# Patient Record
Sex: Female | Born: 1980
Health system: Southern US, Community
[De-identification: ages and names within clinical notes are randomized; demographics above are authoritative.]

## PROBLEM LIST (undated history)

## (undated) ENCOUNTER — Emergency Department (HOSPITAL_COMMUNITY): Admission: EM | Payer: Commercial Managed Care - PPO | Source: Home / Self Care

## (undated) DIAGNOSIS — N200 Calculus of kidney: Secondary | ICD-10-CM

## (undated) DIAGNOSIS — D649 Anemia, unspecified: Secondary | ICD-10-CM

## (undated) HISTORY — DX: Anemia, unspecified: D64.9

## (undated) HISTORY — DX: Calculus of kidney: N20.0

---

## 2004-11-27 ENCOUNTER — Other Ambulatory Visit: Admission: RE | Admit: 2004-11-27 | Discharge: 2004-11-27 | Payer: Self-pay | Admitting: Gynecology

## 2005-11-28 ENCOUNTER — Other Ambulatory Visit: Admission: RE | Admit: 2005-11-28 | Discharge: 2005-11-28 | Payer: Self-pay | Admitting: Gynecology

## 2007-07-04 ENCOUNTER — Inpatient Hospital Stay (HOSPITAL_COMMUNITY): Admission: AD | Admit: 2007-07-04 | Discharge: 2007-07-06 | Payer: Self-pay | Admitting: Obstetrics and Gynecology

## 2007-08-05 ENCOUNTER — Inpatient Hospital Stay (HOSPITAL_COMMUNITY): Admission: AD | Admit: 2007-08-05 | Discharge: 2007-08-07 | Payer: Self-pay | Admitting: Obstetrics and Gynecology

## 2008-07-07 ENCOUNTER — Emergency Department (HOSPITAL_COMMUNITY): Admission: EM | Admit: 2008-07-07 | Discharge: 2008-07-08 | Payer: Self-pay | Admitting: Family Medicine

## 2010-03-04 ENCOUNTER — Inpatient Hospital Stay (HOSPITAL_COMMUNITY): Admission: AD | Admit: 2010-03-04 | Discharge: 2010-03-06 | Payer: Self-pay | Admitting: Obstetrics & Gynecology

## 2010-04-02 IMAGING — CT CT PELVIS W/O CM
2 of 4 series · 17 of 46 positions shown, 19 images · non-contrast
Comparison: None available.

CT ABDOMEN

CLINICAL DATA: Gross hematuria.  Severe right flank pain.

CT ABDOMEN AND PELVIS WITHOUT CONTRAST
TECHNIQUE: Multidetector CT imaging of the abdomen and pelvis was
performed following the standard protocol without intravenous
contrast.

[Series 2: renal stone 5.0 b31f st · axial · 0.64mm/px · z∈[-502,-192]mm · 14 of 68 slices shown, 16 images]
[im 3/68  soft-tissue]
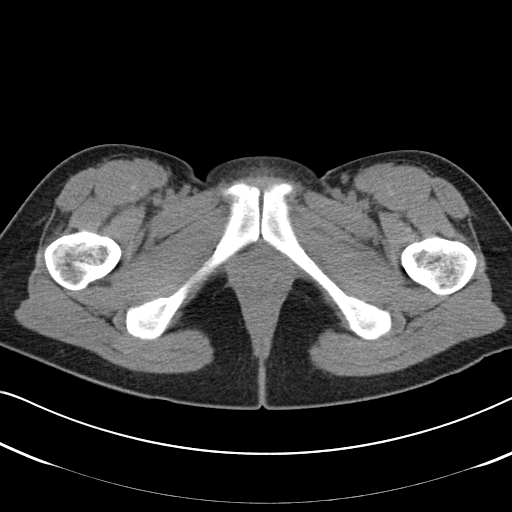
[im 3/68  bone]
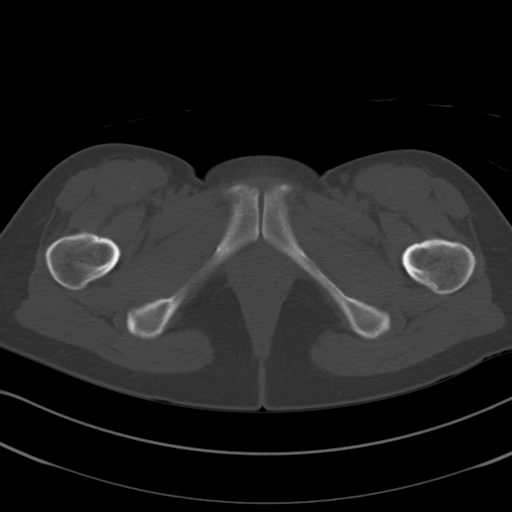
[im 9/68  soft-tissue]
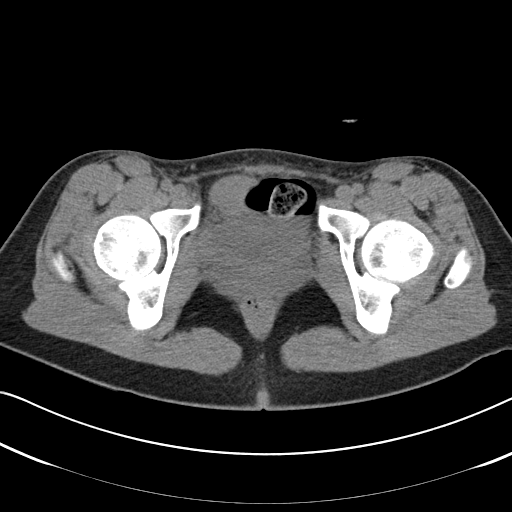
[im 14/68  soft-tissue]
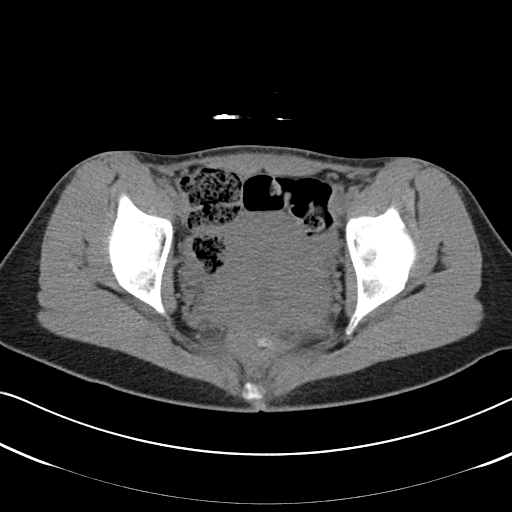
[im 19/68  soft-tissue]
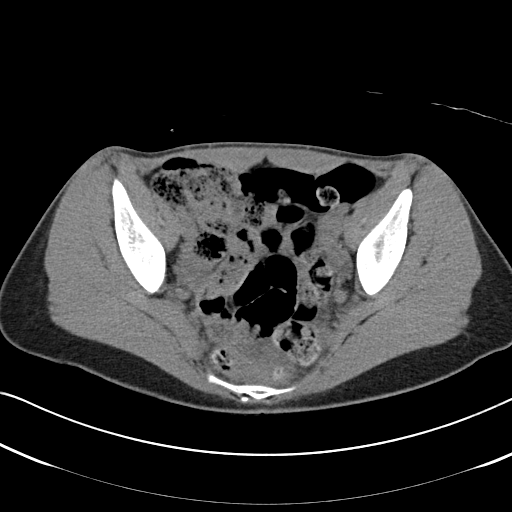
[im 22/68  soft-tissue]
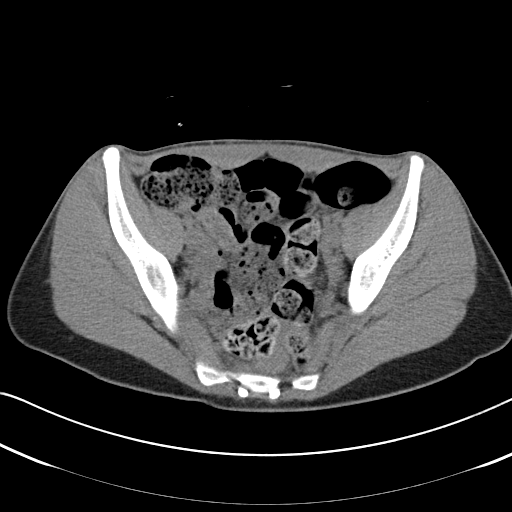
[im 27/68  soft-tissue]
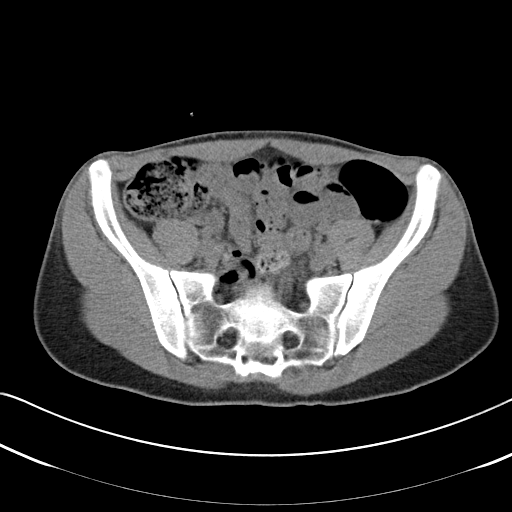
[im 33/68  soft-tissue]
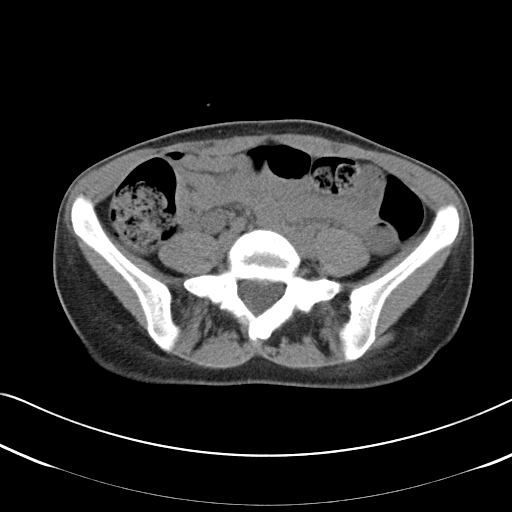
[im 35/68  soft-tissue]
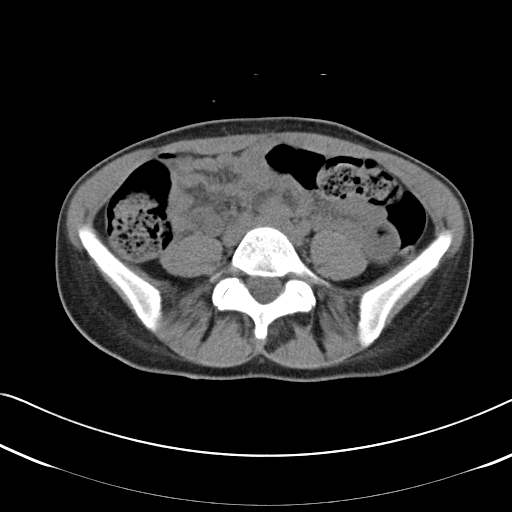
[im 41/68  soft-tissue]
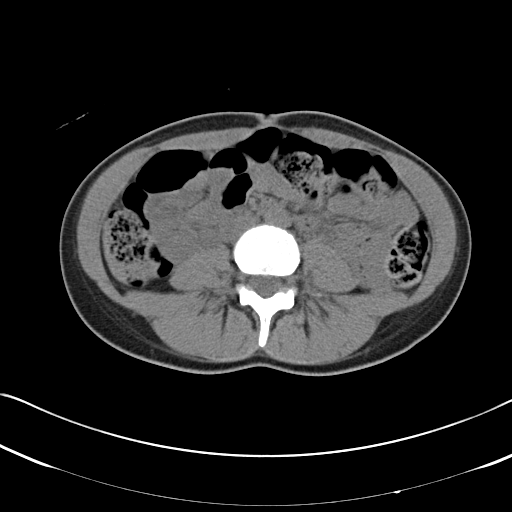
[im 41/68  bone]
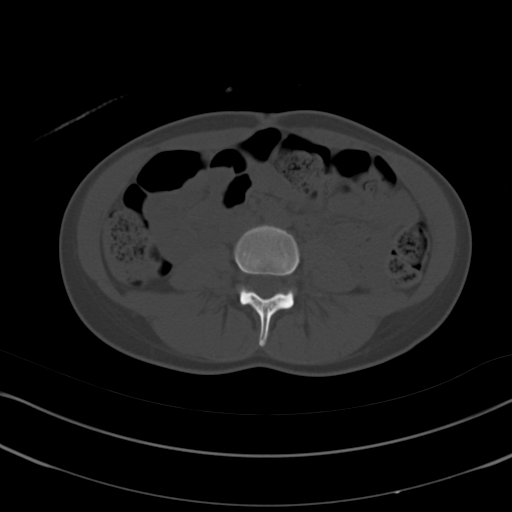
[im 46/68  soft-tissue]
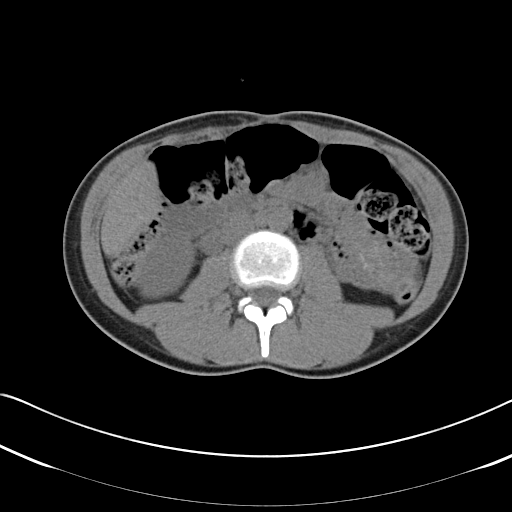
[im 51/68  soft-tissue]
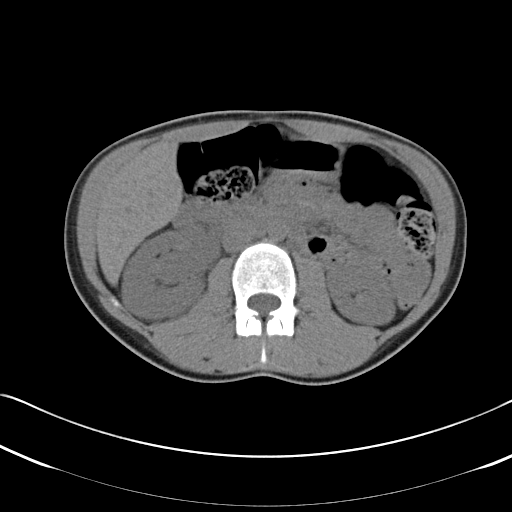
[im 54/68  soft-tissue]
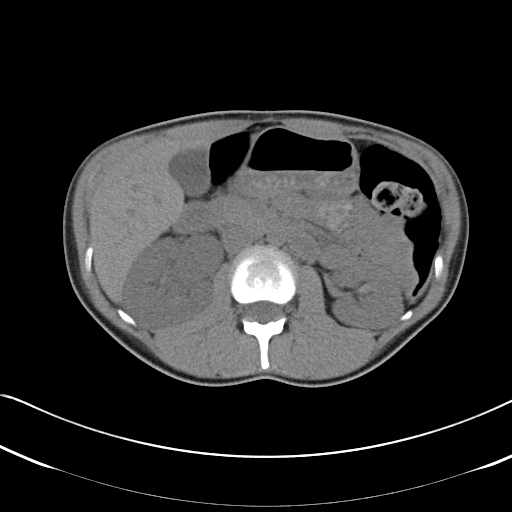
[im 59/68  soft-tissue]
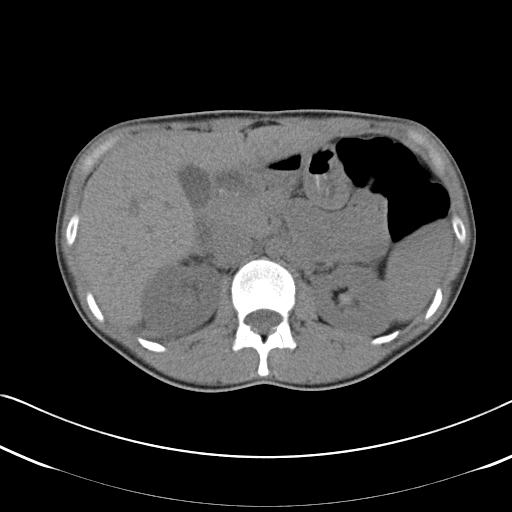
[im 65/68  soft-tissue]
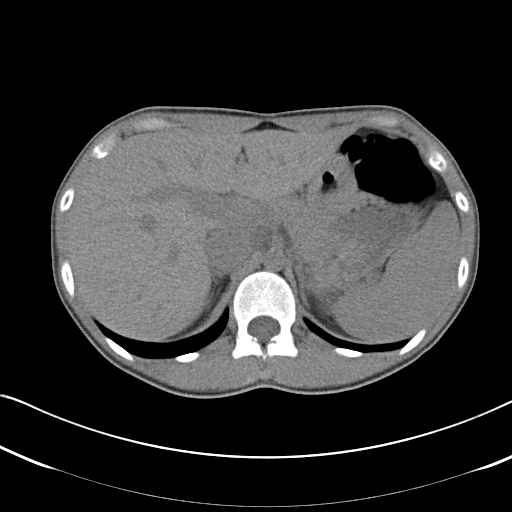

[Series 602: coronal abd · coronal · 0.94mm/px · 3 of 86 slices shown]
[im 29/86  soft-tissue]
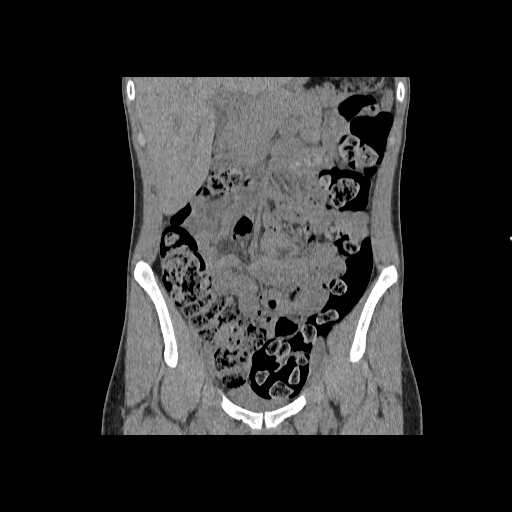
[im 38/86  soft-tissue]
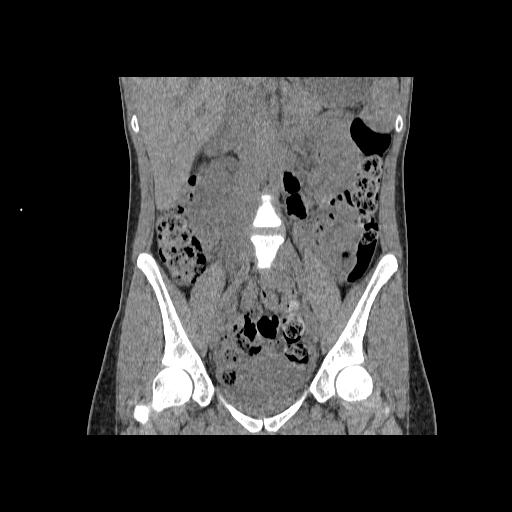
[im 48/86  soft-tissue]
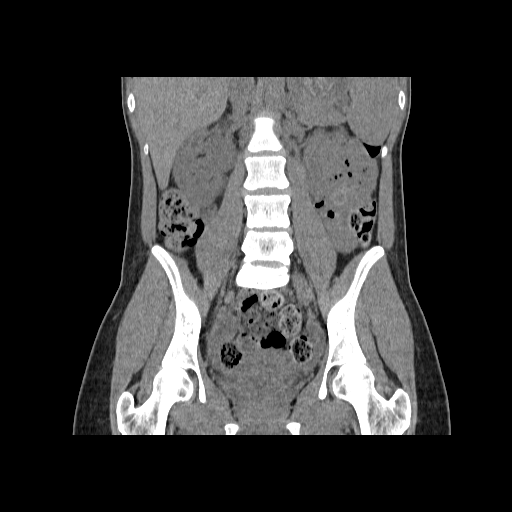

[17 of 46 positions shown; findings below may reference images not displayed]

FINDINGS: No pleural effusion is identified.  Small amount of lung
parenchyma which is visualized is clear.

The patient has severe right hydronephrosis and marked dilatation
of the right ureter due to a 0.3 cm stone at the right
ureterovesicle junction.  No other urinary tract stones are
identified on the right or left.

The visualized portions of the liver, gallbladder, spleen, pancreas
and adrenal glands have a normal uninfused appearance.  Stomach and
small bowel are unremarkable.  No bony abnormality.
IMPRESSION: Severe right hydronephrosis due to a 0.3 cm right UVJ stone.
Abdomen is otherwise negative.

CT PELVIS
FINDINGS: Uterus and adnexa are unremarkable.  Moderate stool
burden is noted.  Colon is otherwise normal appearance.  No pelvic
fluid or lymphadenopathy.  No focal bony abnormality.
IMPRESSION: 1.  Right UVJ stone is again noted.
2.  Moderate stool burden.

## 2010-09-29 LAB — CBC
MCHC: 34.2 g/dL (ref 30.0–36.0)
MCV: 97.7 fL (ref 78.0–100.0)
Platelets: 117 10*3/uL — ABNORMAL LOW (ref 150–400)
Platelets: 126 10*3/uL — ABNORMAL LOW (ref 150–400)
RBC: 3.57 MIL/uL — ABNORMAL LOW (ref 3.87–5.11)
RBC: 4.39 MIL/uL (ref 3.87–5.11)
RDW: 13.1 % (ref 11.5–15.5)
WBC: 12.4 10*3/uL — ABNORMAL HIGH (ref 4.0–10.5)

## 2010-11-28 NOTE — Discharge Summary (Signed)
Megan Stout, Megan Stout                 ACCOUNT NO.:  [id number]   MEDICAL RECORD NO.:  [id number]          PATIENT TYPE:  INP   LOCATION:  9156                          FACILITY:  [facility]   PHYSICIAN:  Ilda Mori, M.D.   DATE OF BIRTH:  Feb 12, 1981   DATE OF ADMISSION:  07/04/2007  DATE OF DISCHARGE:  07/06/2007                               DISCHARGE SUMMARY   FINAL DIAGNOSIS:  Preterm labor, intrauterine pregnancy 33 weeks,  undelivered.   SECONDARY DIAGNOSIS:  None.   PROCEDURES:  1. IV tocolysis.  2. Betamethasone.  3. Steroid protocol for lung maturity.   COMPLICATIONS:  None.   CONDITION ON DISCHARGE:  Improved.   This is a 30 year old primigravid female who was admitted to the  hospital with a cervical exam of 3 cm dilated, 60% effaced, -2 station,  in the office on December 19.  The patient denied feeling severe  contractions but was having some pressure which prompted the pelvic  exam.  In the hospital, the patient was noted to have contractions, and  IV magnesium was instituted.  In addition, the patient was started on a  dexamethasone protocol, to accelerate fetal lung maturity.  The  patient's hospital course was uncomplicated.  She did respond to the  magnesium, with a significant decrease in the the frequency of uterine  contractions.  On the morning of December 21, a fetal fibronectin was  obtained, and this came back as positive.  A group B strep is still  pending.  Despite the fact that she had positive fetal fibronectin, the  patient was feeling good and having minimal uterine contractions.  She  completed her 48-hour steroid course.  In addition a pelvic exam was  performed which was reassuring with a cervical thickness of 2-3 cm, and  the internal os only 1 to 2 cm dilated.  She was therefore discharged,  told to eat a regular diet, was asked to maintain bedrest with bathroom  privileges.  She was given Procardia to take 4 times a day, 20 mg, to  help quiet  the uterus and told to continue prenatal vitamins.  She was  asked to return to office in 2 weeks for follow-up evaluation.  Her  laboratory data showed an ultrasound with good fetal growth (64  percentile),  cervical length was read out as 9 to 10 mm with funneling  at the internal os.  Her group B strep is pending at the time of this  dictation.      Ilda Mori, M.D.  Electronically Signed     RK/MEDQ  D:  07/06/2007  T:  07/07/2007  Job:  [id number]

## 2011-04-05 LAB — CBC
HCT: 31.5 — ABNORMAL LOW
HCT: 40.4
Hemoglobin: 11.1 — ABNORMAL LOW
MCHC: 34.6
MCHC: 35.4
MCV: 95.3
Platelets: 153
RBC: 3.33 — ABNORMAL LOW
RDW: 12.4

## 2011-04-05 LAB — RPR: RPR Ser Ql: NONREACTIVE

## 2011-04-20 LAB — POCT URINALYSIS DIP (DEVICE)
Glucose, UA: NEGATIVE mg/dL
Nitrite: NEGATIVE
Protein, ur: 100 mg/dL — AB
Specific Gravity, Urine: 1.025 (ref 1.005–1.030)
Urobilinogen, UA: 0.2 mg/dL (ref 0.0–1.0)
pH: 6 (ref 5.0–8.0)

## 2011-04-20 LAB — STREP B DNA PROBE

## 2011-04-20 LAB — DIFFERENTIAL
Basophils Absolute: 0 10*3/uL (ref 0.0–0.1)
Basophils Relative: 0 % (ref 0–1)
Eosinophils Absolute: 0.1 10*3/uL (ref 0.0–0.7)
Lymphocytes Relative: 10 % — ABNORMAL LOW (ref 12–46)
Monocytes Relative: 7 % (ref 3–12)
Neutrophils Relative %: 83 % — ABNORMAL HIGH (ref 43–77)

## 2011-04-20 LAB — BASIC METABOLIC PANEL
BUN: 21 mg/dL (ref 6–23)
Chloride: 102 mEq/L (ref 96–112)
GFR calc Af Amer: >60 mL/min (ref >60)
GFR calc non Af Amer: >60 mL/min (ref >60)
Sodium: 137 mEq/L (ref 135–145)

## 2011-04-20 LAB — CBC: WBC: 11.6 10*3/uL — ABNORMAL HIGH (ref 4.0–10.5)

## 2011-04-20 LAB — FETAL FIBRONECTIN: Fetal Fibronectin: POSITIVE

## 2013-12-31 ENCOUNTER — Encounter: Payer: Self-pay | Admitting: Women's Health

## 2013-12-31 ENCOUNTER — Other Ambulatory Visit (HOSPITAL_COMMUNITY)
Admission: RE | Admit: 2013-12-31 | Discharge: 2013-12-31 | Disposition: A | Discharge status: Home / Self Care | Payer: 59 | Source: Ambulatory Visit | Attending: Gynecology | Admitting: Gynecology

## 2013-12-31 ENCOUNTER — Ambulatory Visit (INDEPENDENT_AMBULATORY_CARE_PROVIDER_SITE_OTHER): Payer: 59 | Admitting: Women's Health

## 2013-12-31 VITALS — BP 116/70 | Ht 65.0 in | Wt 121.0 lb

## 2013-12-31 DIAGNOSIS — Z01419 Encounter for gynecological examination (general) (routine) without abnormal findings: Secondary | ICD-10-CM

## 2013-12-31 NOTE — Progress Notes (Signed)
Megan Stout 04/19/1981 [id number]    History:    Presents for annual exam.  Regular monthly cycle with occasional spotting at questionable ovulation time/vasectomy. Reports normal Paps.  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 Father committed suicide 3 years ago.   ROS:  A  12 point ROS was performed and pertinent positives and negatives are included.  Exam:  Filed Vitals:   12/31/13 1557  BP: 116/70    General appearance:  Normal Thyroid:  Symmetrical, normal in size, without palpable masses or nodularity. Respiratory  Auscultation:  Clear without wheezing or rhonchi Cardiovascular  Auscultation:  Regular rate, without rubs, murmurs or gallops  Edema/varicosities:  Not grossly evident Abdominal  Soft,nontender, without masses, guarding or rebound.  Liver/spleen:  No organomegaly noted  Hernia:  None appreciated  Skin  Inspection:  Grossly normal   Breasts: Examined lying and sitting.     Right: Without masses, retractions, discharge or axillary adenopathy.     Left: Without masses, retractions, discharge or axillary adenopathy. Gentitourinary   Inguinal/mons:  Normal without inguinal adenopathy  External genitalia:  Normal  BUS/Urethra/Skene's glands:  Normal  Vagina:  Normal  Cervix:  Normal  Uterus:  normal in size, shape and contour.  Midline and mobile  Adnexa/parametria:     Rt: Without masses or tenderness.   Lt: Without masses or tenderness.  Anus and perineum: Normal  Digital rectal exam: Normal sphincter tone without palpated masses or tenderness  Assessment/Plan:  33 y.o.MWF G2P2  for annual exam with no complaints.  Normal GYN exam/vasectomy  Plan: Questions if anything can be done to postpone cycle, on day 6 of cycle, options  reviewed. NuvaRing sample placed, leave in 4 weeks, remove after trip. Reviewed slight risk for blood clots and strokes. Had been on OC's in the past without problem. DEXA to keep menstrual calendar if continued spotting instructed to call. SBE's, regular exercise, calcium rich diet, vitamin D 1000 daily encouraged. CBC, lipid panel, UA, Pap.  Note: This dictation was prepared with Dragon/digital dictation.  Any transcriptional errors that result are unintentional. Megan ChallengerYOUNG,Megan J [facility] HospitalWHNP, 5:11 PM 12/31/2013

## 2013-12-31 NOTE — Patient Instructions (Signed)

## 2014-01-01 LAB — CBC WITH DIFFERENTIAL/PLATELET
BASOS ABS: 0.1 10*3/uL (ref 0.0–0.1)
Basophils Relative: 1 % (ref 0–1)
EOS ABS: 0.1 10*3/uL (ref 0.0–0.7)
Eosinophils Relative: 2 % (ref 0–5)
HCT: 37.3 % (ref 36.0–46.0)
Hemoglobin: 12.3 g/dL (ref 12.0–15.0)
Lymphocytes Relative: 32 % (ref 12–46)
Lymphs Abs: 1.7 10*3/uL (ref 0.7–4.0)
MCH: 29.2 pg (ref 26.0–34.0)
MCHC: 33 g/dL (ref 30.0–36.0)
MCV: 88.6 fL (ref 78.0–100.0)
MONO ABS: 0.5 10*3/uL (ref 0.1–1.0)
Monocytes Relative: 10 % (ref 3–12)
NEUTROS PCT: 55 % (ref 43–77)
Neutro Abs: 2.9 10*3/uL (ref 1.7–7.7)
Platelets: 201 10*3/uL (ref 150–400)
RBC: 4.21 MIL/uL (ref 3.87–5.11)
RDW: 13.8 % (ref 11.5–15.5)
WBC: 5.2 10*3/uL (ref 4.0–10.5)

## 2014-01-01 LAB — URINALYSIS W MICROSCOPIC + REFLEX CULTURE
Bilirubin Urine: NEGATIVE
CRYSTALS: NONE SEEN
Casts: NONE SEEN
Glucose, UA: NEGATIVE mg/dL
Ketones, ur: NEGATIVE mg/dL
Leukocytes, UA: NEGATIVE
Nitrite: POSITIVE — AB
PROTEIN: NEGATIVE mg/dL
SPECIFIC GRAVITY, URINE: 1.023 (ref 1.005–1.030)
UROBILINOGEN UA: 0.2 mg/dL (ref 0.0–1.0)
pH: 6 (ref 5.0–8.0)

## 2014-01-01 LAB — LIPID PANEL
CHOLESTEROL: 128 mg/dL (ref 0–200)
HDL: 53 mg/dL (ref >39)
LDL Cholesterol: 59 mg/dL (ref 0–99)
Total CHOL/HDL Ratio: 2.4 Ratio
Triglycerides: 80 mg/dL (ref <150)
VLDL: 16 mg/dL (ref 0–40)

## 2014-01-03 LAB — URINE CULTURE

## 2014-01-04 LAB — CYTOLOGY - PAP

## 2014-01-05 ENCOUNTER — Other Ambulatory Visit: Payer: Self-pay | Admitting: Women's Health

## 2014-01-05 MED ORDER — CIPROFLOXACIN HCL 500 MG PO TABS: 500.0000 mg | ORAL_TABLET | Freq: Two times a day (BID) | ORAL | Status: DC

## 2014-04-30 ENCOUNTER — Other Ambulatory Visit: Payer: Self-pay

## 2014-05-17 ENCOUNTER — Encounter: Payer: Self-pay | Admitting: Women's Health

## 2015-01-04 ENCOUNTER — Encounter: Payer: Self-pay | Admitting: Women's Health

## 2015-01-04 ENCOUNTER — Ambulatory Visit (INDEPENDENT_AMBULATORY_CARE_PROVIDER_SITE_OTHER): Payer: 59 | Admitting: Women's Health

## 2015-01-04 VITALS — BP 118/80 | Ht 65.0 in | Wt 120.0 lb

## 2015-01-04 DIAGNOSIS — Z01419 Encounter for gynecological examination (general) (routine) without abnormal findings: Secondary | ICD-10-CM

## 2015-01-04 NOTE — Patient Instructions (Signed)

## 2015-01-04 NOTE — Progress Notes (Signed)
Megan Stout 1980-07-31 [id number]    History:    Presents for annual exam.  Regular monthly cycles/vasectomy. Normal Pap history. Normal labs at work.  Past medical history, past surgical history, family history and social history were all reviewed and documented in the EPIC chart. 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 Building a new home. Father suicide 2012.  ROS:  A ROS was performed and pertinent positives and negatives are included.  Exam:  Filed Vitals:   01/04/15 0900  BP: 118/80    General appearance:  Normal Thyroid:  Symmetrical, normal in size, without palpable masses or nodularity. Respiratory  Auscultation:  Clear without wheezing or rhonchi Cardiovascular  Auscultation:  Regular rate, without rubs, murmurs or gallops  Edema/varicosities:  Not grossly evident Abdominal  Soft,nontender, without masses, guarding or rebound.  Liver/spleen:  No organomegaly noted  Hernia:  None appreciated  Skin  Inspection:  Grossly normal   Breasts: Examined lying and sitting.     Right: Without masses, retractions, discharge or axillary adenopathy.     Left: Without masses, retractions, discharge or axillary adenopathy. Gentitourinary   Inguinal/mons:  Normal without inguinal adenopathy  External genitalia:  Normal  BUS/Urethra/Skene's glands:  Normal  Vagina:  Normal  Cervix:  Normal  Uterus:  normal in size, shape and contour.  Midline and mobile  Adnexa/parametria:     Rt: Without masses or tenderness.   Lt: Without masses or tenderness.  Anus and perineum: Normal  Digital rectal exam: Normal sphincter tone without palpated masses or tenderness  Assessment/Plan:  34 y.o.MWF G2P2  for annual exam with no complaints.  Monthly cycle/vasectomy  Plan: UA, Pap normal 2015, new Pap screening guidelines reviewed. Normal labs through work. SBE's, exercise, calcium rich diet, vitamin D 1000 daily  encouraged.  Harrington Challenger [facility], 9:30 AM 01/04/2015

## 2015-01-05 LAB — URINALYSIS W MICROSCOPIC + REFLEX CULTURE
Bilirubin Urine: NEGATIVE
Casts: NONE SEEN
Crystals: NONE SEEN
GLUCOSE, UA: NEGATIVE mg/dL
Hgb urine dipstick: NEGATIVE
Ketones, ur: NEGATIVE mg/dL
LEUKOCYTES UA: NEGATIVE
Nitrite: NEGATIVE
PROTEIN: NEGATIVE mg/dL
Specific Gravity, Urine: 1.023 (ref 1.005–1.030)
UROBILINOGEN UA: 0.2 mg/dL (ref 0.0–1.0)
pH: 7.5 (ref 5.0–8.0)

## 2015-06-04 ENCOUNTER — Other Ambulatory Visit (HOSPITAL_COMMUNITY): Payer: Self-pay | Admitting: Internal Medicine

## 2015-06-04 ENCOUNTER — Ambulatory Visit (HOSPITAL_COMMUNITY)
Admission: RE | Admit: 2015-06-04 | Discharge: 2015-06-04 | Disposition: A | Discharge status: Home / Self Care | Payer: 59 | Source: Ambulatory Visit | Attending: Internal Medicine | Admitting: Internal Medicine

## 2015-06-04 DIAGNOSIS — R52 Pain, unspecified: Secondary | ICD-10-CM

## 2015-06-04 DIAGNOSIS — R079 Chest pain, unspecified: Secondary | ICD-10-CM | POA: Diagnosis not present

## 2016-02-08 ENCOUNTER — Encounter: Payer: Self-pay | Admitting: Women's Health

## 2016-02-08 ENCOUNTER — Ambulatory Visit (INDEPENDENT_AMBULATORY_CARE_PROVIDER_SITE_OTHER): Payer: 59 | Admitting: Women's Health

## 2016-02-08 VITALS — BP 118/78 | Ht 65.0 in | Wt 124.0 lb

## 2016-02-08 DIAGNOSIS — Z01419 Encounter for gynecological examination (general) (routine) without abnormal findings: Secondary | ICD-10-CM

## 2016-02-08 NOTE — Patient Instructions (Signed)
Health Maintenance, Female Adopting a healthy lifestyle and getting preventive care can go a long way to promote health and wellness. Talk with your health care provider about what schedule of regular examinations is right for you. This is a good chance for you to check in with your provider about disease prevention and staying healthy. In between checkups, there are plenty of things you can do on your own. Experts have done a lot of research about which lifestyle changes and preventive measures are most likely to keep you healthy. Ask your health care provider for more information. WEIGHT AND DIET  Eat a healthy diet  Be sure to include plenty of vegetables, fruits, low-fat dairy products, and lean protein.  Do not eat a lot of foods high in solid fats, added sugars, or salt.  Get regular exercise. This is one of the most important things you can do for your health.  Most adults should exercise for at least 150 minutes each week. The exercise should increase your heart rate and make you sweat (moderate-intensity exercise).  Most adults should also do strengthening exercises at least twice a week. This is in addition to the moderate-intensity exercise.  Maintain a healthy weight  Body mass index (BMI) is a measurement that can be used to identify possible weight problems. It estimates body fat based on height and weight. Your health care provider can help determine your BMI and help you achieve or maintain a healthy weight.  For females 20 years of age and older:   A BMI below 18.5 is considered underweight.  A BMI of 18.5 to 24.9 is normal.  A BMI of 25 to 29.9 is considered overweight.  A BMI of 30 and above is considered obese.  Watch levels of cholesterol and blood lipids  You should start having your blood tested for lipids and cholesterol at 34 years of age, then have this test every 5 years.  You may need to have your cholesterol levels checked more often if:  Your lipid  or cholesterol levels are high.  You are older than 34 years of age.  You are at high risk for heart disease.  CANCER SCREENING   Lung Cancer  Lung cancer screening is recommended for adults 55-80 years old who are at high risk for lung cancer because of a history of smoking.  A yearly low-dose CT scan of the lungs is recommended for people who:  Currently smoke.  Have quit within the past 15 years.  Have at least a 30-pack-year history of smoking. A pack year is smoking an average of one pack of cigarettes a day for 1 year.  Yearly screening should continue until it has been 15 years since you quit.  Yearly screening should stop if you develop a health problem that would prevent you from having lung cancer treatment.  Breast Cancer  Practice breast self-awareness. This means understanding how your breasts normally appear and feel.  It also means doing regular breast self-exams. Let your health care provider know about any changes, no matter how small.  If you are in your 20s or 30s, you should have a clinical breast exam (CBE) by a health care provider every 1-3 years as part of a regular health exam.  If you are 40 or older, have a CBE every year. Also consider having a breast X-ray (mammogram) every year.  If you have a family history of breast cancer, talk to your health care provider about genetic screening.  If you   are at high risk for breast cancer, talk to your health care provider about having an MRI and a mammogram every year.  Breast cancer gene (BRCA) assessment is recommended for women who have family members with BRCA-related cancers. BRCA-related cancers include:  Breast.  Ovarian.  Tubal.  Peritoneal cancers.  Results of the assessment will determine the need for genetic counseling and BRCA1 and BRCA2 testing. Cervical Cancer Your health care provider may recommend that you be screened regularly for cancer of the pelvic organs (ovaries, uterus, and  vagina). This screening involves a pelvic examination, including checking for microscopic changes to the surface of your cervix (Pap test). You may be encouraged to have this screening done every 3 years, beginning at age 21.  For women ages 30-65, health care providers may recommend pelvic exams and Pap testing every 3 years, or they may recommend the Pap and pelvic exam, combined with testing for human papilloma virus (HPV), every 5 years. Some types of HPV increase your risk of cervical cancer. Testing for HPV may also be done on women of any age with unclear Pap test results.  Other health care providers may not recommend any screening for nonpregnant women who are considered low risk for pelvic cancer and who do not have symptoms. Ask your health care provider if a screening pelvic exam is right for you.  If you have had past treatment for cervical cancer or a condition that could lead to cancer, you need Pap tests and screening for cancer for at least 20 years after your treatment. If Pap tests have been discontinued, your risk factors (such as having a new sexual partner) need to be reassessed to determine if screening should resume. Some women have medical problems that increase the chance of getting cervical cancer. In these cases, your health care provider may recommend more frequent screening and Pap tests. Colorectal Cancer  This type of cancer can be detected and often prevented.  Routine colorectal cancer screening usually begins at 34 years of age and continues through 34 years of age.  Your health care provider may recommend screening at an earlier age if you have risk factors for colon cancer.  Your health care provider may also recommend using home test kits to check for hidden blood in the stool.  A small camera at the end of a tube can be used to examine your colon directly (sigmoidoscopy or colonoscopy). This is done to check for the earliest forms of colorectal  cancer.  Routine screening usually begins at age 50.  Direct examination of the colon should be repeated every 5-10 years through 34 years of age. However, you may need to be screened more often if early forms of precancerous polyps or small growths are found. Skin Cancer  Check your skin from head to toe regularly.  Tell your health care provider about any new moles or changes in moles, especially if there is a change in a mole's shape or color.  Also tell your health care provider if you have a mole that is larger than the size of a pencil eraser.  Always use sunscreen. Apply sunscreen liberally and repeatedly throughout the day.  Protect yourself by wearing long sleeves, pants, a wide-brimmed hat, and sunglasses whenever you are outside. HEART DISEASE, DIABETES, AND HIGH BLOOD PRESSURE   High blood pressure causes heart disease and increases the risk of stroke. High blood pressure is more likely to develop in:  People who have blood pressure in the high end   of the normal range (130-139/85-89 mm Hg).  People who are overweight or obese.  People who are African American.  If you are 38-23 years of age, have your blood pressure checked every 3-5 years. If you are 61 years of age or older, have your blood pressure checked every year. You should have your blood pressure measured twice--once when you are at a hospital or clinic, and once when you are not at a hospital or clinic. Record the average of the two measurements. To check your blood pressure when you are not at a hospital or clinic, you can use:  An automated blood pressure machine at a pharmacy.  A home blood pressure monitor.  If you are between 45 years and 39 years old, ask your health care provider if you should take aspirin to prevent strokes.  Have regular diabetes screenings. This involves taking a blood sample to check your fasting blood sugar level.  If you are at a normal weight and have a low risk for diabetes,  have this test once every three years after 34 years of age.  If you are overweight and have a high risk for diabetes, consider being tested at a younger age or more often. PREVENTING INFECTION  Hepatitis B  If you have a higher risk for hepatitis B, you should be screened for this virus. You are considered at high risk for hepatitis B if:  You were born in a country where hepatitis B is common. Ask your health care provider which countries are considered high risk.  Your parents were born in a high-risk country, and you have not been immunized against hepatitis B (hepatitis B vaccine).  You have HIV or AIDS.  You use needles to inject street drugs.  You live with someone who has hepatitis B.  You have had sex with someone who has hepatitis B.  You get hemodialysis treatment.  You take certain medicines for conditions, including cancer, organ transplantation, and autoimmune conditions. Hepatitis C  Blood testing is recommended for:  Everyone born from [birth year] through 1964.  Anyone with known risk factors for hepatitis C. Sexually transmitted infections (STIs)  You should be screened for sexually transmitted infections (STIs) including gonorrhea and chlamydia if:  You are sexually active and are younger than 34 years of age.  You are older than 34 years of age and your health care provider tells you that you are at risk for this type of infection.  Your sexual activity has changed since you were last screened and you are at an increased risk for chlamydia or gonorrhea. Ask your health care provider if you are at risk.  If you do not have HIV, but are at risk, it may be recommended that you take a prescription medicine daily to prevent HIV infection. This is called pre-exposure prophylaxis (PrEP). You are considered at risk if:  You are sexually active and do not regularly use condoms or know the HIV status of your partner(s).  You take drugs by injection.  You are sexually  active with a partner who has HIV. Talk with your health care provider about whether you are at high risk of being infected with HIV. If you choose to begin PrEP, you should first be tested for HIV. You should then be tested every 3 months for as long as you are taking PrEP.  PREGNANCY   If you are premenopausal and you may become pregnant, ask your health care provider about preconception counseling.  If you may  become pregnant, take 400 to 800 micrograms (mcg) of folic acid every day.  If you want to prevent pregnancy, talk to your health care provider about birth control (contraception). OSTEOPOROSIS AND MENOPAUSE   Osteoporosis is a disease in which the bones lose minerals and strength with aging. This can result in serious bone fractures. Your risk for osteoporosis can be identified using a bone density scan.  If you are 61 years of age or older, or if you are at risk for osteoporosis and fractures, ask your health care provider if you should be screened.  Ask your health care provider whether you should take a calcium or vitamin D supplement to lower your risk for osteoporosis.  Menopause may have certain physical symptoms and risks.  Hormone replacement therapy may reduce some of these symptoms and risks. Talk to your health care provider about whether hormone replacement therapy is right for you.  HOME CARE INSTRUCTIONS   Schedule regular health, dental, and eye exams.  Stay current with your immunizations.   Do not use any tobacco products including cigarettes, chewing tobacco, or electronic cigarettes.  If you are pregnant, do not drink alcohol.  If you are breastfeeding, limit how much and how often you drink alcohol.  Limit alcohol intake to no more than 1 drink per day for nonpregnant women. One drink equals 12 ounces of beer, 5 ounces of wine, or 1 ounces of hard liquor.  Do not use street drugs.  Do not share needles.  Ask your health care provider for help if  you need support or information about quitting drugs.  Tell your health care provider if you often feel depressed.  Tell your health care provider if you have ever been abused or do not feel safe at home.   This information is not intended to replace advice given to you by your health care provider. Make sure you discuss any questions you have with your health care provider.   Document Released: 01/15/2011 Document Revised: 07/23/2014 Document Reviewed: 06/03/2013 Elsevier Interactive Patient Education [employer].

## 2016-02-08 NOTE — Progress Notes (Signed)
Megan Stout [birth year] [id number]    History:    Presents for annual exam.  Monthly cycle every 22-26 days/vasectomy. Normal Pap history. Labs at work lipid panel good, hemoglobin A1c 5.5.  Past medical history, past surgical history, family history and social history were all reviewed and documented in the EPIC chart. 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 All doing well.  ROS:  A ROS was performed and pertinent positives and negatives are included.  Exam:  Vitals:   02/08/16 1537  BP: 118/78    General appearance:  Normal Thyroid:  Symmetrical, normal in size, without palpable masses or nodularity. Respiratory  Auscultation:  Clear without wheezing or rhonchi Cardiovascular  Auscultation:  Regular rate, without rubs, murmurs or gallops  Edema/varicosities:  Not grossly evident Abdominal  Soft,nontender, without masses, guarding or rebound.  Liver/spleen:  No organomegaly noted  Hernia:  None appreciated  Skin  Inspection:  Grossly normal   Breasts: Examined lying and sitting.     Right: Without masses, retractions, discharge or axillary adenopathy.     Left: Without masses, retractions, discharge or axillary adenopathy. Gentitourinary   Inguinal/mons:  Normal without inguinal adenopathy  External genitalia:  Normal  BUS/Urethra/Skene's glands:  Normal  Vagina:  Normal  Cervix:  Normal  Uterus:   normal in size, shape and contour.  Midline and mobile  Adnexa/parametria:     Rt: Without masses or tenderness.   Lt: Without masses or tenderness.  Anus and perineum: Normal  Digital rectal exam: Normal sphincter tone without palpated masses or tenderness  Assessment/Plan:  35 y.o. MWF G2 P2  for annual exam with no complaints.  Monthly cycle/vasectomy  Plan: Labs from work reviewed, normal lipid panel, hemoglobin A1c 5.5, encouraged to continue healthy lifestyle of low sugar diet, regular exercise and maintaining weight. SBE's, exercise,  calcium rich diet, vitamin D 1000 daily encouraged. UA, Pap with HR HPV typing, new screening guidelines reviewed.    Harrington Challenger [facility], 4:35 PM 02/08/2016

## 2016-02-09 LAB — URINALYSIS W MICROSCOPIC + REFLEX CULTURE
BACTERIA UA: NONE SEEN [HPF]
BILIRUBIN URINE: NEGATIVE
CRYSTALS: NONE SEEN [HPF]
Casts: NONE SEEN [LPF]
GLUCOSE, UA: NEGATIVE
Hgb urine dipstick: NEGATIVE
KETONES UR: NEGATIVE
LEUKOCYTES UA: NEGATIVE
Nitrite: NEGATIVE
Protein, ur: NEGATIVE
SPECIFIC GRAVITY, URINE: 1.022 (ref 1.001–1.035)
WBC UA: NONE SEEN WBC/HPF (ref <=5)
Yeast: NONE SEEN [HPF]
pH: 7.5 (ref 5.0–8.0)

## 2016-02-10 LAB — PAP, TP IMAGING W/ HPV RNA, RFLX HPV TYPE 16,18/45: HPV mRNA, High Risk: NOT DETECTED

## 2016-02-10 LAB — URINE CULTURE

## 2016-10-31 ENCOUNTER — Other Ambulatory Visit: Payer: Self-pay | Admitting: Women's Health

## 2016-10-31 ENCOUNTER — Telehealth: Payer: Self-pay | Admitting: *Deleted

## 2016-10-31 DIAGNOSIS — Z30011 Encounter for initial prescription of contraceptive pills: Secondary | ICD-10-CM

## 2016-10-31 MED ORDER — NORETHIN ACE-ETH ESTRAD-FE 1-20 MG-MCG PO TABS: ORAL_TABLET | ORAL | 0 refills | Status: DC

## 2016-10-31 NOTE — Telephone Encounter (Signed)
Phone call, currently on cycle desiring to postpone cycle for June, will start on pills today take daily until May 16, start new pack on May 20, and continue until returning from vacation. Reviewed slight risk for blood clots and strokes. Has been on in the past without problem. Nonsmoker. Husband vasectomy. 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 Loestrin E scribe to pharmacy.

## 2016-10-31 NOTE — Telephone Encounter (Signed)
Pt has a trip to [place] scheduled in June, the week she will be gone cycle is due to start. Pt asked if birth control could be prescribed to have no cycle the week of her trip? Please advise

## 2016-11-28 ENCOUNTER — Encounter: Payer: Self-pay | Admitting: Gynecology

## 2017-02-08 ENCOUNTER — Encounter: Payer: 59 | Admitting: Women's Health

## 2017-02-12 ENCOUNTER — Encounter: Payer: Self-pay | Admitting: Women's Health

## 2017-02-12 ENCOUNTER — Ambulatory Visit (INDEPENDENT_AMBULATORY_CARE_PROVIDER_SITE_OTHER): Payer: 59 | Admitting: Women's Health

## 2017-02-12 VITALS — BP 116/76 | Ht 66.0 in | Wt 125.8 lb

## 2017-02-12 DIAGNOSIS — Z13220 Encounter for screening for lipoid disorders: Secondary | ICD-10-CM

## 2017-02-12 DIAGNOSIS — Z01419 Encounter for gynecological examination (general) (routine) without abnormal findings: Secondary | ICD-10-CM | POA: Diagnosis not present

## 2017-02-12 LAB — CBC WITH DIFFERENTIAL/PLATELET
BASOS ABS: 43 {cells}/uL (ref 0–200)
Basophils Relative: 1 %
EOS ABS: 43 {cells}/uL (ref 15–500)
Eosinophils Relative: 1 %
HCT: 34.7 % — ABNORMAL LOW (ref 35.0–45.0)
Hemoglobin: 11.1 g/dL — ABNORMAL LOW (ref 11.7–15.5)
LYMPHS PCT: 33 %
Lymphs Abs: 1419 cells/uL (ref 850–3900)
MCH: 28.1 pg (ref 27.0–33.0)
MCHC: 32 g/dL (ref 32.0–36.0)
MCV: 87.8 fL (ref 80.0–100.0)
MONOS PCT: 11 %
MPV: 12 fL (ref 7.5–12.5)
Monocytes Absolute: 473 cells/uL (ref 200–950)
Neutro Abs: 2322 cells/uL (ref 1500–7800)
Neutrophils Relative %: 54 %
PLATELETS: 169 10*3/uL (ref 140–400)
RBC: 3.95 MIL/uL (ref 3.80–5.10)
RDW: 16.4 % — AB (ref 11.0–15.0)
WBC: 4.3 10*3/uL (ref 3.8–10.8)

## 2017-02-12 NOTE — Progress Notes (Signed)
Crist FatJoy Mortell 01/13/1981 [id number]    History:    Presents for annual exam.  Monthly cycle/vasectomy. Normal Pap history.  Past medical history, past surgical history, family history and social history were all reviewed and documented in the EPIC chart. 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 Mother healthy. Father deceased from suicide from depression.  ROS:  A ROS was performed and pertinent positives and negatives are included.  Exam:  Vitals:   02/12/17 0823  BP: 116/76  Weight: 125 lb 12.8 oz (57.1 kg)  Height: 5\' 6"  (1.676 m)   Body mass index is 20.3 kg/m.   General appearance:  Normal Thyroid:  Symmetrical, normal in size, without palpable masses or nodularity. Respiratory  Auscultation:  Clear without wheezing or rhonchi Cardiovascular  Auscultation:  Regular rate, without rubs, murmurs or gallops  Edema/varicosities:  Not grossly evident Abdominal  Soft,nontender, without masses, guarding or rebound.  Liver/spleen:  No organomegaly noted  Hernia:  None appreciated  Skin  Inspection:  Grossly normal   Breasts: Examined lying and sitting.     Right: Without masses, retractions, discharge or axillary adenopathy.     Left: Without masses, retractions, discharge or axillary adenopathy. Gentitourinary   Inguinal/mons:  Normal without inguinal adenopathy  External genitalia:  Normal  BUS/Urethra/Skene's glands:  Normal  Vagina:  Normal  Cervix:  Normal  Uterus:  normal in size, shape and contour.  Midline and mobile  Adnexa/parametria:     Rt: Without masses or tenderness.   Lt: Without masses or tenderness.  Anus and perineum: Normal  Digital rectal exam: Normal sphincter tone without palpated masses or tenderness  Assessment/Plan:  36 y.o. MWF G3 P2  for annual exam with no complaints.  Monthly cycle/vasectomy  Plan: SBE's, continue  regular exercise, calcium rich diet, vitamin D 1000 daily encouraged. CBC, had normal glucose and lipid panel through health screening at work. Pap normal with negative HR HPV 2017, new screening guidelines reviewed.    Harrington Challengerancy J Roslyn Else [facility]NP, 8:24 AM 02/12/2017

## 2017-02-12 NOTE — Patient Instructions (Signed)

## 2017-02-13 ENCOUNTER — Encounter: Payer: Self-pay | Admitting: Women's Health

## 2018-02-17 ENCOUNTER — Encounter: Payer: 59 | Admitting: Women's Health

## 2018-02-24 ENCOUNTER — Ambulatory Visit (INDEPENDENT_AMBULATORY_CARE_PROVIDER_SITE_OTHER): Payer: 59 | Admitting: Women's Health

## 2018-02-24 ENCOUNTER — Encounter: Payer: Self-pay | Admitting: Women's Health

## 2018-02-24 VITALS — BP 122/80 | Ht 66.0 in | Wt 131.0 lb

## 2018-02-24 DIAGNOSIS — Z01419 Encounter for gynecological examination (general) (routine) without abnormal findings: Secondary | ICD-10-CM | POA: Diagnosis not present

## 2018-02-24 DIAGNOSIS — Z13220 Encounter for screening for lipoid disorders: Secondary | ICD-10-CM | POA: Diagnosis not present

## 2018-02-24 LAB — LIPID PANEL
CHOLESTEROL: 144 mg/dL (ref <200)
HDL: 59 mg/dL (ref >50)
LDL Cholesterol (Calc): 72 mg/dL (calc)
Non-HDL Cholesterol (Calc): 85 mg/dL (calc) (ref <130)
TRIGLYCERIDES: 46 mg/dL (ref <150)
Total CHOL/HDL Ratio: 2.4 (calc) (ref <5.0)

## 2018-02-24 LAB — CBC WITH DIFFERENTIAL/PLATELET
BASOS PCT: 1 %
Basophils Absolute: 42 cells/uL (ref 0–200)
EOS PCT: 1.7 %
Eosinophils Absolute: 71 cells/uL (ref 15–500)
HCT: 31.6 % — ABNORMAL LOW (ref 35.0–45.0)
Hemoglobin: 9.8 g/dL — ABNORMAL LOW (ref 11.7–15.5)
Lymphs Abs: 1483 cells/uL (ref 850–3900)
MCH: 25.6 pg — ABNORMAL LOW (ref 27.0–33.0)
MCHC: 31 g/dL — ABNORMAL LOW (ref 32.0–36.0)
MCV: 82.5 fL (ref 80.0–100.0)
MONOS PCT: 15.3 %
MPV: 12.2 fL (ref 7.5–12.5)
NEUTROS PCT: 46.7 %
Neutro Abs: 1961 cells/uL (ref 1500–7800)
PLATELETS: 212 10*3/uL (ref 140–400)
RBC: 3.83 10*6/uL (ref 3.80–5.10)
RDW: 14.5 % (ref 11.0–15.0)
TOTAL LYMPHOCYTE: 35.3 %
WBC mixed population: 643 cells/uL (ref 200–950)
WBC: 4.2 10*3/uL (ref 3.8–10.8)

## 2018-02-24 LAB — GLUCOSE, RANDOM: GLUCOSE: 91 mg/dL (ref 65–99)

## 2018-02-24 NOTE — Progress Notes (Signed)
Crist FatJoy Votta 09/21/1980 [id number]    History:    Presents for annual exam.  Monthly 5-6 day cycle/vasectomy.  Normal Pap history.  Past medical history, past surgical history, family history and social history were all reviewed and documented in the EPIC chart.  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  Mother healthy.  Daughter 8, son [phone] and stepson [phone] all doing well.  ROS:  A ROS was performed and pertinent positives and negatives are included.  Exam:  Vitals:   02/24/18 0827  BP: 122/80  Weight: 131 lb (59.4 kg)  Height: 5\' 6"  (1.676 m)   Body mass index is 21.14 kg/m.   General appearance:  Normal Thyroid:  Symmetrical, normal in size, without palpable masses or nodularity. Respiratory  Auscultation:  Clear without wheezing or rhonchi Cardiovascular  Auscultation:  Regular rate, without rubs, murmurs or gallops  Edema/varicosities:  Not grossly evident Abdominal  Soft,nontender, without masses, guarding or rebound.  Liver/spleen:  No organomegaly noted  Hernia:  None appreciated  Skin  Inspection:  Grossly normal   Breasts: Examined lying and sitting.     Right: Without masses, retractions, discharge or axillary adenopathy.     Left: Without masses, retractions, discharge or axillary adenopathy. Gentitourinary   Inguinal/mons:  Normal without inguinal adenopathy  External genitalia:  Normal  BUS/Urethra/Skene's glands:  Normal  Vagina:  Normal  Cervix:  Normal  Uterus:  normal in size, shape and contour.  Midline and mobile  Adnexa/parametria:     Rt: Without masses or tenderness.   Lt: Without masses or tenderness.  Anus and perineum: Normal  Digital rectal exam: Normal sphincter tone without palpated masses or tenderness  Assessment/Plan:  37 y.o. MWF G3, P2 for annual exam with no complaints.  Regular monthly cycle/vasectomy  Plan: SBE's, annual screening mammogram at 41.  Continue healthy lifestyle of regular  exercise, healthy diet, vitamin D 1000 daily encouraged.  CBC, glucose, lipid panel, Pap normal 2017, new screening guidelines reviewed.    Harrington Challengerancy J Oliviana Mcgahee [facility]NP, 8:45 AM 02/24/2018

## 2018-02-24 NOTE — Patient Instructions (Signed)

## 2018-02-25 ENCOUNTER — Other Ambulatory Visit: Payer: Self-pay | Admitting: Women's Health

## 2018-02-25 DIAGNOSIS — D649 Anemia, unspecified: Secondary | ICD-10-CM

## 2018-02-25 LAB — URINALYSIS, COMPLETE W/RFL CULTURE
BACTERIA UA: NONE SEEN /HPF
BILIRUBIN URINE: NEGATIVE
Glucose, UA: NEGATIVE
HGB URINE DIPSTICK: NEGATIVE
Hyaline Cast: NONE SEEN /LPF
KETONES UR: NEGATIVE
LEUKOCYTE ESTERASE: NEGATIVE
NITRITES URINE, INITIAL: NEGATIVE
PROTEIN: NEGATIVE
RBC / HPF: NONE SEEN /HPF (ref 0–2)
Specific Gravity, Urine: 1.02 (ref 1.001–1.03)
WBC, UA: NONE SEEN /HPF (ref 0–5)
pH: >=8.5 — AB (ref 5.0–8.0)

## 2018-02-25 LAB — NO CULTURE INDICATED

## 2018-03-04 ENCOUNTER — Encounter: Payer: Self-pay | Admitting: Women's Health

## 2018-03-04 NOTE — Progress Notes (Signed)
[facility]LL[facility] FORM COMPLETED AND FAXED

## 2019-03-02 ENCOUNTER — Other Ambulatory Visit: Payer: Self-pay

## 2019-03-03 ENCOUNTER — Ambulatory Visit (INDEPENDENT_AMBULATORY_CARE_PROVIDER_SITE_OTHER): Payer: 59 | Admitting: Women's Health

## 2019-03-03 ENCOUNTER — Encounter: Payer: Self-pay | Admitting: Women's Health

## 2019-03-03 VITALS — BP 110/78 | Ht 66.0 in | Wt 125.0 lb

## 2019-03-03 DIAGNOSIS — Z13220 Encounter for screening for lipoid disorders: Secondary | ICD-10-CM | POA: Diagnosis not present

## 2019-03-03 DIAGNOSIS — Z01419 Encounter for gynecological examination (general) (routine) without abnormal findings: Secondary | ICD-10-CM | POA: Diagnosis not present

## 2019-03-03 DIAGNOSIS — D649 Anemia, unspecified: Secondary | ICD-10-CM | POA: Insufficient documentation

## 2019-03-03 LAB — LIPID PANEL
Cholesterol: 172 mg/dL (ref <200)
HDL: 59 mg/dL (ref >=50)
LDL Cholesterol (Calc): 101 mg/dL (calc) — ABNORMAL HIGH
Non-HDL Cholesterol (Calc): 113 mg/dL (calc) (ref <130)
Total CHOL/HDL Ratio: 2.9 (calc) (ref <5.0)
Triglycerides: 42 mg/dL (ref <150)

## 2019-03-03 LAB — CBC WITH DIFFERENTIAL/PLATELET
Absolute Monocytes: 477 cells/uL (ref 200–950)
Basophils Absolute: 50 cells/uL (ref 0–200)
Basophils Relative: 1.1 %
Eosinophils Absolute: 50 cells/uL (ref 15–500)
Eosinophils Relative: 1.1 %
HCT: 38.9 % (ref 35.0–45.0)
Hemoglobin: 12.7 g/dL (ref 11.7–15.5)
Lymphs Abs: 1337 cells/uL (ref 850–3900)
MCH: 31.1 pg (ref 27.0–33.0)
MCHC: 32.6 g/dL (ref 32.0–36.0)
MCV: 95.1 fL (ref 80.0–100.0)
MPV: 11.7 fL (ref 7.5–12.5)
Monocytes Relative: 10.6 %
Neutro Abs: 2588 cells/uL (ref 1500–7800)
Neutrophils Relative %: 57.5 %
Platelets: 201 10*3/uL (ref 140–400)
RBC: 4.09 10*6/uL (ref 3.80–5.10)
RDW: 12 % (ref 11.0–15.0)
Total Lymphocyte: 29.7 %
WBC: 4.5 10*3/uL (ref 3.8–10.8)

## 2019-03-03 LAB — GLUCOSE, RANDOM: Glucose, Bld: 84 mg/dL (ref 65–99)

## 2019-03-03 NOTE — Addendum Note (Signed)
Addended by: Lorine Bears on: 03/03/2019 08:57 AM   Modules accepted: Orders

## 2019-03-03 NOTE — Progress Notes (Signed)
Megan Stout 1981-03-03 [id number]    History:    Presents for annual exam.  Regular monthly 5-day cycle vasectomy first 2 days heavy flow.  History of anemia/menorrhagia, states cycles are slightly less heavy than they were last year.  Takes an iron supplement several days per week but forgets most.  Normal Pap history.  Past medical history, past surgical history, family history and social history were all reviewed and documented in the EPIC chart.  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  Father depression /suicide  ROS:  A ROS was performed and pertinent positives and negatives are included.  Exam:  Vitals:   03/03/19 0808  BP: 110/78  Weight: 125 lb (56.7 kg)  Height: 5\' 6"  (1.676 m)   Body mass index is 20.18 kg/m.   General appearance:  Normal Thyroid:  Symmetrical, normal in size, without palpable masses or nodularity. Respiratory  Auscultation:  Clear without wheezing or rhonchi Cardiovascular  Auscultation:  Regular rate, without rubs, murmurs or gallops  Edema/varicosities:  Not grossly evident Abdominal  Soft,nontender, without masses, guarding or rebound.  Liver/spleen:  No organomegaly noted  Hernia:  None appreciated  Skin  Inspection:  Grossly normal   Breasts: Examined lying and sitting.     Right: Without masses, retractions, discharge or axillary adenopathy.     Left: Without masses, retractions, discharge or axillary adenopathy. Gentitourinary   Inguinal/mons:  Normal without inguinal adenopathy  External genitalia:  Normal  BUS/Urethra/Skene's glands:  Normal  Vagina:  Normal  Cervix:  Normal  Uterus:  normal in size, shape and contour.  Midline and mobile  Adnexa/parametria:     Rt: Without masses or tenderness.   Lt: Without masses or tenderness.  Anus and perineum: Normal  Digital rectal exam: Normal sphincter tone without palpated masses or  tenderness  Assessment/Plan:  38 y.o. MWF G3, P2 for annual exam with no complaints.  Regular monthly 5-day cycle/vasectomy History of anemia/menorrhagia  Plan: Encouraged woman's One-A-Day that has iron in it, iron rich foods daily.  SBEs, annual screening mammogram at 40, calcium rich foods, continue regular exercise.  Instructed to call if cycles become heavier or problematic.  Options of OCs/IUDs discussed.  CBC, glucose, lipid panel, Pap with HR HPV typing, new screening guidelines reviewed.  Health physical form completed    Huel Cote [facility], 8:25 AM 03/03/2019

## 2019-03-03 NOTE — Patient Instructions (Signed)
Health Maintenance, Female Adopting a healthy lifestyle and getting preventive care are important in promoting health and wellness. Ask your health care provider about:  The right schedule for you to have regular tests and exams.  Things you can do on your own to prevent diseases and keep yourself healthy. What should I know about diet, weight, and exercise? Eat a healthy diet   Eat a diet that includes plenty of vegetables, fruits, low-fat dairy products, and lean protein.  Do not eat a lot of foods that are high in solid fats, added sugars, or sodium. Maintain a healthy weight Body mass index (BMI) is used to identify weight problems. It estimates body fat based on height and weight. Your health care provider can help determine your BMI and help you achieve or maintain a healthy weight. Get regular exercise Get regular exercise. This is one of the most important things you can do for your health. Most adults should:  Exercise for at least 150 minutes each week. The exercise should increase your heart rate and make you sweat (moderate-intensity exercise).  Do strengthening exercises at least twice a week. This is in addition to the moderate-intensity exercise.  Spend less time sitting. Even light physical activity can be beneficial. Watch cholesterol and blood lipids Have your blood tested for lipids and cholesterol at 37 years of age, then have this test every 5 years. Have your cholesterol levels checked more often if:  Your lipid or cholesterol levels are high.  You are older than 37 years of age.  You are at high risk for heart disease. What should I know about cancer screening? Depending on your health history and family history, you may need to have cancer screening at various ages. This may include screening for:  Breast cancer.  Cervical cancer.  Colorectal cancer.  Skin cancer.  Lung cancer. What should I know about heart disease, diabetes, and high blood  pressure? Blood pressure and heart disease  High blood pressure causes heart disease and increases the risk of stroke. This is more likely to develop in people who have high blood pressure readings, are of African descent, or are overweight.  Have your blood pressure checked: ? Every 3-5 years if you are 18-39 years of age. ? Every year if you are 40 years old or older. Diabetes Have regular diabetes screenings. This checks your fasting blood sugar level. Have the screening done:  Once every three years after age 40 if you are at a normal weight and have a low risk for diabetes.  More often and at a younger age if you are overweight or have a high risk for diabetes. What should I know about preventing infection? Hepatitis B If you have a higher risk for hepatitis B, you should be screened for this virus. Talk with your health care provider to find out if you are at risk for hepatitis B infection. Hepatitis C Testing is recommended for:  Everyone born from 1945 through 1965.  Anyone with known risk factors for hepatitis C. Sexually transmitted infections (STIs)  Get screened for STIs, including gonorrhea and chlamydia, if: ? You are sexually active and are younger than 37 years of age. ? You are older than 37 years of age and your health care provider tells you that you are at risk for this type of infection. ? Your sexual activity has changed since you were last screened, and you are at increased risk for chlamydia or gonorrhea. Ask your health care provider if   you are at risk.  Ask your health care provider about whether you are at high risk for HIV. Your health care provider may recommend a prescription medicine to help prevent HIV infection. If you choose to take medicine to prevent HIV, you should first get tested for HIV. You should then be tested every 3 months for as long as you are taking the medicine. Pregnancy  If you are about to stop having your period (premenopausal) and  you may become pregnant, seek counseling before you get pregnant.  Take 400 to 800 micrograms (mcg) of folic acid every day if you become pregnant.  Ask for birth control (contraception) if you want to prevent pregnancy. Osteoporosis and menopause Osteoporosis is a disease in which the bones lose minerals and strength with aging. This can result in bone fractures. If you are 65 years old or older, or if you are at risk for osteoporosis and fractures, ask your health care provider if you should:  Be screened for bone loss.  Take a calcium or vitamin D supplement to lower your risk of fractures.  Be given hormone replacement therapy (HRT) to treat symptoms of menopause. Follow these instructions at home: Lifestyle  Do not use any products that contain nicotine or tobacco, such as cigarettes, e-cigarettes, and chewing tobacco. If you need help quitting, ask your health care provider.  Do not use street drugs.  Do not share needles.  Ask your health care provider for help if you need support or information about quitting drugs. Alcohol use  Do not drink alcohol if: ? Your health care provider tells you not to drink. ? You are pregnant, may be pregnant, or are planning to become pregnant.  If you drink alcohol: ? Limit how much you use to 0-1 drink a day. ? Limit intake if you are breastfeeding.  Be aware of how much alcohol is in your drink. In the [place] one drink equals one 12 oz bottle of beer (355 mL), one 5 oz glass of wine (148 mL), or one 1 oz glass of hard liquor (44 mL). General instructions  Schedule regular health, dental, and eye exams.  Stay current with your vaccines.  Tell your health care provider if: ? You often feel depressed. ? You have ever been abused or do not feel safe at home. Summary  Adopting a healthy lifestyle and getting preventive care are important in promoting health and wellness.  Follow your health care provider's instructions about healthy  diet, exercising, and getting tested or screened for diseases.  Follow your health care provider's instructions on monitoring your cholesterol and blood pressure. This information is not intended to replace advice given to you by your health care provider. Make sure you discuss any questions you have with your health care provider. Document Released: 01/15/2011 Document Revised: 06/25/2018 Document Reviewed: 06/25/2018 Elsevier Patient Education  2020 Elsevier Inc.  

## 2019-03-04 ENCOUNTER — Encounter: Payer: Self-pay | Admitting: Women's Health

## 2019-03-04 LAB — URINALYSIS, COMPLETE W/RFL CULTURE
Bacteria, UA: NONE SEEN /HPF
Bilirubin Urine: NEGATIVE
Glucose, UA: NEGATIVE
Hgb urine dipstick: NEGATIVE
Hyaline Cast: NONE SEEN /LPF
Leukocyte Esterase: NEGATIVE
Nitrites, Initial: NEGATIVE
Protein, ur: NEGATIVE
RBC / HPF: NONE SEEN /HPF (ref 0–2)
Specific Gravity, Urine: 1.024 (ref 1.001–1.03)
Squamous Epithelial / LPF: NONE SEEN /HPF (ref <=5)
WBC, UA: NONE SEEN /HPF (ref 0–5)
pH: 5.5 (ref 5.0–8.0)

## 2019-03-04 LAB — NO CULTURE INDICATED

## 2019-03-05 LAB — PAP, TP IMAGING W/ HPV RNA, RFLX HPV TYPE 16,18/45: HPV DNA High Risk: NOT DETECTED

## 2020-03-09 ENCOUNTER — Encounter: Payer: 59 | Admitting: Nurse Practitioner

## 2021-11-28 ENCOUNTER — Other Ambulatory Visit (HOSPITAL_COMMUNITY)
Admission: RE | Admit: 2021-11-28 | Discharge: 2021-11-28 | Disposition: A | Discharge status: Home / Self Care | Payer: 59 | Source: Ambulatory Visit | Attending: Radiology | Admitting: Radiology

## 2021-11-28 ENCOUNTER — Encounter: Payer: Self-pay | Admitting: Radiology

## 2021-11-28 ENCOUNTER — Ambulatory Visit (INDEPENDENT_AMBULATORY_CARE_PROVIDER_SITE_OTHER): Payer: 59 | Admitting: Radiology

## 2021-11-28 VITALS — BP 110/70 | Ht 65.5 in | Wt 131.0 lb

## 2021-11-28 DIAGNOSIS — D500 Iron deficiency anemia secondary to blood loss (chronic): Secondary | ICD-10-CM | POA: Diagnosis not present

## 2021-11-28 DIAGNOSIS — Z01419 Encounter for gynecological examination (general) (routine) without abnormal findings: Secondary | ICD-10-CM | POA: Diagnosis present

## 2021-11-28 DIAGNOSIS — Z1231 Encounter for screening mammogram for malignant neoplasm of breast: Secondary | ICD-10-CM

## 2021-11-28 NOTE — Progress Notes (Signed)
? ?  Megan Stout 1980/11/29 [id number] ? ? ?History:  41 y.o. G3P2 presents for annual exam. No gyn concerns. Hx of anemia, has not been taking her iron. ? ?Gynecologic History ?Patient's last menstrual period was 10/31/2021 (approximate). ?Period Cycle (Days): 28 ?Period Duration (Days): 6 ?Period Pattern: Regular ?Menstrual Flow: Moderate, Heavy ?Dysmenorrhea: None ?Contraception/Family planning: vasectomy ?Sexually active: yes ?Last Pap: 02/2019. Results were: normal ? ? ?Obstetric History ?OB History  ?Gravida Para Term Preterm AB Living  ?3 2     1 2   ?SAB IAB Ectopic Multiple Live Births  ?1          ?  ?# Outcome Date GA Lbr Len/2nd Weight Sex Delivery Anes PTL Lv  ?3 SAB           ?2 Para           ?1 Para           ? ? ? ?The following portions of the patient's history were reviewed and updated as appropriate: allergies, current medications, past family history, past medical history, past social history, past surgical history, and problem list. ? ?Review of Systems ?Pertinent items noted in HPI and remainder of comprehensive ROS otherwise negative.  ? ?Past medical history, past surgical history, family history and social history were all reviewed and documented in the EPIC chart. ? ? ?Exam: ? ?Vitals:  ? 11/28/21 0929  ?BP: 110/70  ?Weight: 131 lb (59.4 kg)  ?Height: 5' 5.5" (1.664 m)  ? ?Body mass index is 21.47 kg/m?. ? ?General appearance:  Normal ?Thyroid:  Symmetrical, normal in size, without palpable masses or nodularity. ?Respiratory ? Auscultation:  Clear without wheezing or rhonchi ?Cardiovascular ? Auscultation:  Regular rate, without rubs, murmurs or gallops ? Edema/varicosities:  Not grossly evident ?Abdominal ? Soft,nontender, without masses, guarding or rebound. ? Liver/spleen:  No organomegaly noted ? Hernia:  None appreciated ? Skin ? Inspection:  Grossly normal ?Breasts: Examined lying and sitting.  ? Right: Without masses, retractions, nipple discharge or axillary adenopathy. ? ? Left: Without  masses, retractions, nipple discharge or axillary adenopathy. ?Genitourinary  ? Inguinal/mons:  Normal without inguinal adenopathy ? External genitalia:  Normal appearing vulva with no masses, tenderness, or lesions ? BUS/Urethra/Skene's glands:  Normal without masses or exudate ? Vagina:  Normal appearing with normal color and discharge, no lesions ? Cervix:  Normal appearing without discharge or lesions ? Uterus:  Normal in size, shape and contour.  Mobile, nontender ? Adnexa/parametria:   ?  Rt: Normal in size, without masses or tenderness. ?  Lt: Normal in size, without masses or tenderness. ? Anus and perineum: Normal ?  ?Patient informed chaperone available to be present for breast and pelvic exam. Patient has requested no chaperone to be present. Patient has been advised what will be completed during breast and pelvic exam.  ? ?Assessment/Plan:   ?1. Well woman exam with routine gynecological exam ? ?- Cytology - PAP( [place]) ? ?2. Iron deficiency anemia due to chronic blood loss ? ?- CBC ?- B12 and Folate Panel ?- Ferritin ? ?3. Screening mammogram for breast cancer ?Schedule at [facility] ?  ? ? ?Discussed SBE, colonoscopy and DEXA screening as directed/appropriate. Recommend [place] of exercise weekly, including weight bearing exercise. Encouraged the use of seatbelts and sunscreen. ?Return in 1 year for annual or as needed.  ? ? WHNP-BC 9:57 AM 11/28/2021  ?

## 2021-11-29 ENCOUNTER — Other Ambulatory Visit: Payer: Self-pay | Admitting: *Deleted

## 2021-11-29 ENCOUNTER — Other Ambulatory Visit: Payer: Self-pay | Admitting: Radiology

## 2021-11-29 DIAGNOSIS — D500 Iron deficiency anemia secondary to blood loss (chronic): Secondary | ICD-10-CM

## 2021-11-29 DIAGNOSIS — R790 Abnormal level of blood mineral: Secondary | ICD-10-CM

## 2021-11-29 LAB — CBC
HCT: 33.9 % — ABNORMAL LOW (ref 35.0–45.0)
Hemoglobin: 10.5 g/dL — ABNORMAL LOW (ref 11.7–15.5)
MCH: 25.1 pg — ABNORMAL LOW (ref 27.0–33.0)
MCHC: 31 g/dL — ABNORMAL LOW (ref 32.0–36.0)
MCV: 80.9 fL (ref 80.0–100.0)
MPV: 12.5 fL (ref 7.5–12.5)
Platelets: 227 10*3/uL (ref 140–400)
RBC: 4.19 10*6/uL (ref 3.80–5.10)
RDW: 15 % (ref 11.0–15.0)
WBC: 5 10*3/uL (ref 3.8–10.8)

## 2021-11-29 LAB — B12 AND FOLATE PANEL
Folate: 23.1 ng/mL
Vitamin B-12: 331 pg/mL (ref 200–1100)

## 2021-11-29 LAB — FERRITIN: Ferritin: 5 ng/mL — ABNORMAL LOW (ref 16–154)

## 2021-11-29 MED ORDER — ACCRUFER 30 MG PO CAPS: 1.0000 | ORAL_CAPSULE | Freq: Two times a day (BID) | ORAL | 1 refills | Status: DC

## 2021-11-30 ENCOUNTER — Telehealth: Payer: Self-pay

## 2021-11-30 LAB — CYTOLOGY - PAP
Comment: NEGATIVE
Diagnosis: NEGATIVE
High risk HPV: NEGATIVE

## 2021-11-30 NOTE — Telephone Encounter (Signed)
Megan Stout pt calling to report that her Rx accrufer that was sent to pharmacy to help rebuild her iron levels is too expensive $517/month and would need a PA from insurance.   Pt states she has an OTC iron supplement she would promised she would take daily, but isnt quite sure if that would be sufficient enough.  Inquiring if there were a cheaper route.  Please advise.   11/28/21- HGB-10.5, Ferritin-5

## 2021-12-04 NOTE — Telephone Encounter (Signed)
Per ML:  "FeSO4 325 mg PO BID OTC with plenty of dark green veggies."  Pt notified and voiced understanding.  FYI. Pt stated that since she had left the [facility], the pharmacy had informed her that they could use the manufacturer's coupon for one months supply only and she can get one month free. So she will do the accrufer for the one month and start the FeSO4 325mg  supplement after. Appt made for 6 weeks f/u blood work on 01/17/22.

## 2021-12-06 ENCOUNTER — Telehealth: Payer: Self-pay | Admitting: *Deleted

## 2021-12-06 NOTE — Telephone Encounter (Signed)
PA done via cover my meds for Accrufer 30 mg caps. Medication approved from [employer] through 12/06/2022

## 2021-12-15 ENCOUNTER — Other Ambulatory Visit: Payer: Self-pay | Admitting: Radiology

## 2021-12-15 DIAGNOSIS — Z1231 Encounter for screening mammogram for malignant neoplasm of breast: Secondary | ICD-10-CM

## 2021-12-26 ENCOUNTER — Ambulatory Visit
Admission: RE | Admit: 2021-12-26 | Discharge: 2021-12-26 | Disposition: A | Discharge status: Home / Self Care | Payer: 59 | Source: Ambulatory Visit | Attending: Radiology | Admitting: Radiology

## 2021-12-26 DIAGNOSIS — Z1231 Encounter for screening mammogram for malignant neoplasm of breast: Secondary | ICD-10-CM

## 2021-12-27 ENCOUNTER — Other Ambulatory Visit: Payer: Self-pay | Admitting: Radiology

## 2021-12-27 DIAGNOSIS — R928 Other abnormal and inconclusive findings on diagnostic imaging of breast: Secondary | ICD-10-CM

## 2022-01-17 ENCOUNTER — Other Ambulatory Visit: Payer: 59

## 2022-01-17 DIAGNOSIS — R790 Abnormal level of blood mineral: Secondary | ICD-10-CM

## 2022-01-17 DIAGNOSIS — D500 Iron deficiency anemia secondary to blood loss (chronic): Secondary | ICD-10-CM

## 2022-01-18 LAB — CBC
HCT: 39.8 % (ref 35.0–45.0)
Hemoglobin: 12.8 g/dL (ref 11.7–15.5)
MCH: 28.6 pg (ref 27.0–33.0)
MCHC: 32.2 g/dL (ref 32.0–36.0)
MCV: 88.8 fL (ref 80.0–100.0)
MPV: 12.3 fL (ref 7.5–12.5)
Platelets: 211 10*3/uL (ref 140–400)
RBC: 4.48 10*6/uL (ref 3.80–5.10)
RDW: 18.6 % — ABNORMAL HIGH (ref 11.0–15.0)
WBC: 6.1 10*3/uL (ref 3.8–10.8)

## 2022-01-18 LAB — FERRITIN: Ferritin: 17 ng/mL (ref 16–154)

## 2022-01-18 NOTE — Progress Notes (Signed)
Labs improved, continue accrufrer as ordered for anemia

## 2022-01-30 ENCOUNTER — Other Ambulatory Visit: Payer: Self-pay | Admitting: Radiology

## 2022-01-30 ENCOUNTER — Ambulatory Visit
Admission: RE | Admit: 2022-01-30 | Discharge: 2022-01-30 | Disposition: A | Discharge status: Home / Self Care | Payer: 59 | Source: Ambulatory Visit | Attending: Radiology | Admitting: Radiology

## 2022-01-30 DIAGNOSIS — R928 Other abnormal and inconclusive findings on diagnostic imaging of breast: Secondary | ICD-10-CM

## 2022-01-31 ENCOUNTER — Other Ambulatory Visit: Payer: Self-pay

## 2022-01-31 DIAGNOSIS — R790 Abnormal level of blood mineral: Secondary | ICD-10-CM

## 2022-01-31 MED ORDER — ACCRUFER 30 MG PO CAPS: 1.0000 | ORAL_CAPSULE | Freq: Two times a day (BID) | ORAL | 1 refills | Status: DC

## 2022-01-31 NOTE — Telephone Encounter (Signed)
Last AEX-11/28/21  CBC rechecked 01/17/22- HGB @ 12.8

## 2022-07-12 ENCOUNTER — Other Ambulatory Visit: Payer: Self-pay | Admitting: Radiology

## 2022-07-12 ENCOUNTER — Ambulatory Visit
Admission: RE | Admit: 2022-07-12 | Discharge: 2022-07-12 | Disposition: A | Discharge status: Home / Self Care | Payer: 59 | Source: Ambulatory Visit | Attending: Radiology | Admitting: Radiology

## 2022-07-12 DIAGNOSIS — R928 Other abnormal and inconclusive findings on diagnostic imaging of breast: Secondary | ICD-10-CM

## 2022-07-20 ENCOUNTER — Other Ambulatory Visit: Payer: Self-pay | Admitting: Radiology

## 2022-07-20 DIAGNOSIS — R921 Mammographic calcification found on diagnostic imaging of breast: Secondary | ICD-10-CM

## 2022-12-04 ENCOUNTER — Ambulatory Visit: Payer: 59 | Admitting: Radiology

## 2022-12-27 ENCOUNTER — Encounter: Payer: Self-pay | Admitting: Radiology

## 2022-12-27 ENCOUNTER — Ambulatory Visit (INDEPENDENT_AMBULATORY_CARE_PROVIDER_SITE_OTHER): Payer: 59 | Admitting: Radiology

## 2022-12-27 VITALS — BP 100/60 | Ht 65.5 in | Wt 129.0 lb

## 2022-12-27 DIAGNOSIS — D500 Iron deficiency anemia secondary to blood loss (chronic): Secondary | ICD-10-CM

## 2022-12-27 DIAGNOSIS — Z01419 Encounter for gynecological examination (general) (routine) without abnormal findings: Secondary | ICD-10-CM

## 2022-12-27 NOTE — Progress Notes (Signed)
   Megan Stout 11/04/1980 [id number]   History:  42 y.o. G3P2 presents for annual exam. Anemic, more consistent taking iron, will recheck today along with fasting labs. Periods can be heavy occasionally. No other gyn concerns.  Gynecologic History Patient's last menstrual period was 12/27/2022 (exact date). Period Cycle (Days): 26 Period Duration (Days): 6 Period Pattern: Regular Menstrual Flow: Moderate, Heavy Menstrual Control: Maxi pad, Thin pad (menstural cup) Dysmenorrhea: None Contraception/Family planning: vasectomy Sexually active: yes Last Pap: 2023. Results were: normal Last mammogram: 2024. Results were: normal repeat 6 months, dense tissue  Obstetric History OB History  Gravida Para Term Preterm AB Living  3 2     1 2   SAB IAB Ectopic Multiple Live Births  1            # Outcome Date GA Lbr Len/2nd Weight Sex Delivery Anes PTL Lv  3 SAB           2 Para           1 Para              The following portions of the patient's history were reviewed and updated as appropriate: allergies, current medications, past family history, past medical history, past social history, past surgical history, and problem list.  Review of Systems Pertinent items noted in HPI and remainder of comprehensive ROS otherwise negative.   Past medical history, past surgical history, family history and social history were all reviewed and documented in the EPIC chart.   Exam:  Vitals:   12/27/22 0853  BP: 100/60  Weight: 129 lb (58.5 kg)  Height: 5' 5.5" (1.664 m)   Body mass index is 21.14 kg/m.  General appearance:  Normal Thyroid:  Symmetrical, normal in size, without palpable masses or nodularity. Respiratory  Auscultation:  Clear without wheezing or rhonchi Cardiovascular  Auscultation:  Regular rate, without rubs, murmurs or gallops  Edema/varicosities:  Not grossly evident Abdominal  Soft,nontender, without masses, guarding or rebound.  Liver/spleen:  No organomegaly  noted  Hernia:  None appreciated  Skin  Inspection:  Grossly normal Breasts: Examined lying and sitting.   Right: Without masses, retractions, nipple discharge or axillary adenopathy.   Left: Without masses, retractions, nipple discharge or axillary adenopathy. Genitourinary   Inguinal/mons:  Normal without inguinal adenopathy  External genitalia:  Normal appearing vulva with no masses, tenderness, or lesions  BUS/Urethra/Skene's glands:  Normal without masses or exudate  Vagina:  Normal appearing with normal color and discharge, no lesions  Cervix:  Normal appearing without discharge or lesions  Uterus:  Normal in size, shape and contour.  Mobile, nontender  Adnexa/parametria:     Rt: Normal in size, without masses or tenderness.   Lt: Normal in size, without masses or tenderness.  Anus and perineum: Normal   Raynelle Fanning, CMA present for exam  Assessment/Plan:   1. Well woman exam with routine gynecological exam - Lipid Profile - Comp Met (CMET)  2. Iron deficiency anemia due to chronic blood loss - CBC - Ferritin - B12 and Folate Panel     Discussed SBE, pap screening as directed/appropriate. Recommend of exercise weekly, including weight bearing exercise. Encouraged the use of seatbelts and sunscreen. Return in 1 year for annual or as needed.   Arlie Solomons B WHNP-BC 9:12 AM 12/27/2022

## 2022-12-28 ENCOUNTER — Ambulatory Visit
Admission: RE | Admit: 2022-12-28 | Discharge: 2022-12-28 | Disposition: A | Discharge status: Home / Self Care | Payer: 59 | Source: Ambulatory Visit | Attending: Radiology | Admitting: Radiology

## 2022-12-28 DIAGNOSIS — R921 Mammographic calcification found on diagnostic imaging of breast: Secondary | ICD-10-CM

## 2022-12-28 LAB — COMPREHENSIVE METABOLIC PANEL
AG Ratio: 1.4 (calc) (ref 1.0–2.5)
ALT: 10 U/L (ref 6–29)
AST: 16 U/L (ref 10–30)
Albumin: 3.9 g/dL (ref 3.6–5.1)
Alkaline phosphatase (APISO): 76 U/L (ref 31–125)
BUN: 14 mg/dL (ref 7–25)
CO2: 27 mmol/L (ref 20–32)
Calcium: 8.8 mg/dL (ref 8.6–10.2)
Chloride: 105 mmol/L (ref 98–110)
Creat: 0.82 mg/dL (ref 0.50–0.99)
Globulin: 2.7 g/dL (calc) (ref 1.9–3.7)
Glucose, Bld: 86 mg/dL (ref 65–99)
Potassium: 4 mmol/L (ref 3.5–5.3)
Sodium: 137 mmol/L (ref 135–146)
Total Bilirubin: 0.6 mg/dL (ref 0.2–1.2)
Total Protein: 6.6 g/dL (ref 6.1–8.1)

## 2022-12-28 LAB — CBC
HCT: 40.3 % (ref 35.0–45.0)
Hemoglobin: 13.1 g/dL (ref 11.7–15.5)
MCH: 30.9 pg (ref 27.0–33.0)
MCHC: 32.5 g/dL (ref 32.0–36.0)
MCV: 95 fL (ref 80.0–100.0)
MPV: 11.9 fL (ref 7.5–12.5)
Platelets: 193 10*3/uL (ref 140–400)
RBC: 4.24 10*6/uL (ref 3.80–5.10)
RDW: 12.1 % (ref 11.0–15.0)
WBC: 5.3 10*3/uL (ref 3.8–10.8)

## 2022-12-28 LAB — LIPID PANEL
Cholesterol: 177 mg/dL (ref <200)
HDL: 61 mg/dL (ref >=50)
LDL Cholesterol (Calc): 102 mg/dL (calc) — ABNORMAL HIGH
Non-HDL Cholesterol (Calc): 116 mg/dL (calc) (ref <130)
Total CHOL/HDL Ratio: 2.9 (calc) (ref <5.0)
Triglycerides: 54 mg/dL (ref <150)

## 2022-12-28 LAB — EXTRA SPECIMEN

## 2022-12-28 LAB — B12 AND FOLATE PANEL
Folate: 17.8 ng/mL
Vitamin B-12: 280 pg/mL (ref 200–1100)

## 2022-12-28 LAB — FERRITIN: Ferritin: 27 ng/mL (ref 16–232)

## 2023-12-31 ENCOUNTER — Other Ambulatory Visit: Payer: Self-pay | Admitting: Radiology

## 2023-12-31 DIAGNOSIS — Z09 Encounter for follow-up examination after completed treatment for conditions other than malignant neoplasm: Secondary | ICD-10-CM

## 2023-12-31 DIAGNOSIS — R921 Mammographic calcification found on diagnostic imaging of breast: Secondary | ICD-10-CM

## 2024-01-08 ENCOUNTER — Ambulatory Visit
Admission: RE | Admit: 2024-01-08 | Discharge: 2024-01-08 | Disposition: A | Discharge status: Home / Self Care | Source: Ambulatory Visit | Attending: Radiology | Admitting: Radiology

## 2024-01-08 DIAGNOSIS — R921 Mammographic calcification found on diagnostic imaging of breast: Secondary | ICD-10-CM

## 2024-01-08 DIAGNOSIS — Z09 Encounter for follow-up examination after completed treatment for conditions other than malignant neoplasm: Secondary | ICD-10-CM

## 2024-02-04 ENCOUNTER — Ambulatory Visit (INDEPENDENT_AMBULATORY_CARE_PROVIDER_SITE_OTHER): Admitting: Radiology

## 2024-02-04 ENCOUNTER — Encounter: Payer: Self-pay | Admitting: Radiology

## 2024-02-04 VITALS — BP 102/64 | HR 71 | Ht 66.25 in | Wt 133.6 lb

## 2024-02-04 DIAGNOSIS — Z01419 Encounter for gynecological examination (general) (routine) without abnormal findings: Secondary | ICD-10-CM | POA: Diagnosis not present

## 2024-02-04 DIAGNOSIS — Z1331 Encounter for screening for depression: Secondary | ICD-10-CM | POA: Diagnosis not present

## 2024-02-04 NOTE — Progress Notes (Signed)
 Megan Stout 06/07/1981 [id number]   History:  43 y.o. G3P2 presents for annual exam. No gyn concerns.  Gynecologic History Patient's last menstrual period was 01/09/2024 (exact date). Period Cycle (Days): 28 Period Duration (Days): 6 Menstrual Flow: Moderate Menstrual Control:  (menstrual cup) Dysmenorrhea: None Contraception/Family planning: vasectomy Sexually active: yes Last Pap: 2023. Results were: normal Last mammogram: 01/08/24. Results were: normal  Obstetric History OB History  Gravida Para Term Preterm AB Living  3 2   1 2   SAB IAB Ectopic Multiple Live Births  1        # Outcome Date GA Lbr Len/2nd Weight Sex Type Anes PTL Lv  3 SAB           2 Para           1 Para                02/04/2024   12:10 PM  Depression screen PHQ 2/9  Decreased Interest 0  Down, Depressed, Hopeless 0  PHQ - 2 Score 0     The following portions of the patient's history were reviewed and updated as appropriate: allergies, current medications, past family history, past medical history, past social history, past surgical history, and problem list.  Review of Systems  All other systems reviewed and are negative.   Past medical history, past surgical history, family history and social history were all reviewed and documented in the EPIC chart.  Exam:  Vitals:   02/04/24 1207  BP: 102/64  Pulse: 71  SpO2: 99%  Weight: 133 lb 9.6 oz (60.6 kg)  Height: 5' 6.25 (1.683 m)   Body mass index is 21.4 kg/m.  Physical Exam Vitals and nursing note reviewed. Exam conducted with a chaperone present.  Constitutional:      Appearance: Normal appearance. She is normal weight.  HENT:     Head: Normocephalic and atraumatic.  Neck:     Thyroid: No thyroid mass, thyromegaly or thyroid tenderness.  Cardiovascular:     Rate and Rhythm: Regular rhythm.     Heart sounds: Normal heart sounds.  Pulmonary:     Effort: Pulmonary effort is normal.     Breath sounds: Normal breath sounds.   Chest:  Breasts:    Breasts are symmetrical.     Right: Normal. No inverted nipple, mass, nipple discharge, skin change or tenderness.     Left: Normal. No inverted nipple, mass, nipple discharge, skin change or tenderness.  Abdominal:     General: Abdomen is flat. Bowel sounds are normal.     Palpations: Abdomen is soft.  Genitourinary:    General: Normal vulva.     Vagina: Normal. No vaginal discharge, bleeding or lesions.     Cervix: Normal. No discharge or lesion.     Uterus: Normal. Not enlarged and not tender.      Adnexa: Right adnexa normal and left adnexa normal.       Right: No mass, tenderness or fullness.         Left: No mass, tenderness or fullness.    Lymphadenopathy:     Upper Body:     Right upper body: No axillary adenopathy.     Left upper body: No axillary adenopathy.  Skin:    General: Skin is warm and dry.  Neurological:     Mental Status: She is alert and oriented to person, place, and time.  Psychiatric:        Mood and Affect: Mood normal.  Thought Content: Thought content normal.        Judgment: Judgment normal.      Megan Stout, CMA present for exam  Assessment/Plan:   1. Well woman exam with routine gynecological exam (Primary) Pap 2026 Mammo yearly Colon cancer screening at 46  2. Depression screening negative    Return in about 1 year (around 02/03/2025) for Annual.  Megan Stout WHNP-BC 12:23 PM 02/04/2024

## 2024-02-04 NOTE — Patient Instructions (Signed)
 Preventive Care 16-42 Years Old, Female  Preventive care refers to lifestyle choices and visits with your health care provider that can promote health and wellness. Preventive care visits are also called wellness exams.  What can I expect for my preventive care visit?  Counseling  Your health care provider may ask you questions about your:  Medical history, including:  Past medical problems.  Family medical history.  Pregnancy history.  Current health, including:  Menstrual cycle.  Method of birth control.  Emotional well-being.  Home life and relationship well-being.  Sexual activity and sexual health.  Lifestyle, including:  Alcohol, nicotine or tobacco, and drug use.  Access to firearms.  Diet, exercise, and sleep habits.  Work and work Astronomer.  Sunscreen use.  Safety issues such as seatbelt and bike helmet use.  Physical exam  Your health care provider will check your:  Height and weight. These may be used to calculate your BMI (body mass index). BMI is a measurement that tells if you are at a healthy weight.  Waist circumference. This measures the distance around your waistline. This measurement also tells if you are at a healthy weight and may help predict your risk of certain diseases, such as type 2 diabetes and high blood pressure.  Heart rate and blood pressure.  Body temperature.  Skin for abnormal spots.  What immunizations do I need?    Vaccines are usually given at various ages, according to a schedule. Your health care provider will recommend vaccines for you based on your age, medical history, and lifestyle or other factors, such as travel or where you work.  What tests do I need?  Screening  Your health care provider may recommend screening tests for certain conditions. This may include:  Lipid and cholesterol levels.  Diabetes screening. This is done by checking your blood sugar (glucose) after you have not eaten for a while (fasting).  Pelvic exam and Pap test.  Hepatitis B test.  Hepatitis C  test.  HIV (human immunodeficiency virus) test.  STI (sexually transmitted infection) testing, if you are at risk.  Lung cancer screening.  Colorectal cancer screening.  Mammogram. Talk with your health care provider about when you should start having regular mammograms. This may depend on whether you have a family history of breast cancer.  BRCA-related cancer screening. This may be done if you have a family history of breast, ovarian, tubal, or peritoneal cancers.  Bone density scan. This is done to screen for osteoporosis.  Talk with your health care provider about your test results, treatment options, and if necessary, the need for more tests.  Follow these instructions at home:  Eating and drinking    Eat a diet that includes fresh fruits and vegetables, whole grains, lean protein, and low-fat dairy products.  Take vitamin and mineral supplements as recommended by your health care provider.  Do not drink alcohol if:  Your health care provider tells you not to drink.  You are pregnant, may be pregnant, or are planning to become pregnant.  If you drink alcohol:  Limit how much you have to 0-1 drink a day.  Know how much alcohol is in your drink. In the [place] one drink equals one 12 oz bottle of beer (355 mL), one 5 oz glass of wine (148 mL), or one 1 oz glass of hard liquor (44 mL).  Lifestyle  Brush your teeth every morning and night with fluoride toothpaste. Floss one time each day.  Exercise for at least  30 minutes 5 or more days each week.  Do not use any products that contain nicotine or tobacco. These products include cigarettes, chewing tobacco, and vaping devices, such as e-cigarettes. If you need help quitting, ask your health care provider.  Do not use drugs.  If you are sexually active, practice safe sex. Use a condom or other form of protection to prevent STIs.  If you do not wish to become pregnant, use a form of birth control. If you plan to become pregnant, see your health care provider for a  prepregnancy visit.  Take aspirin only as told by your health care provider. Make sure that you understand how much to take and what form to take. Work with your health care provider to find out whether it is safe and beneficial for you to take aspirin daily.  Find healthy ways to manage stress, such as:  Meditation, yoga, or listening to music.  Journaling.  Talking to a trusted person.  Spending time with friends and family.  Minimize exposure to UV radiation to reduce your risk of skin cancer.  Safety  Always wear your seat belt while driving or riding in a vehicle.  Do not drive:  If you have been drinking alcohol. Do not ride with someone who has been drinking.  When you are tired or distracted.  While texting.  If you have been using any mind-altering substances or drugs.  Wear a helmet and other protective equipment during sports activities.  If you have firearms in your house, make sure you follow all gun safety procedures.  Seek help if you have been physically or sexually abused.  What's next?  Visit your health care provider once a year for an annual wellness visit.  Ask your health care provider how often you should have your eyes and teeth checked.  Stay up to date on all vaccines.  This information is not intended to replace advice given to you by your health care provider. Make sure you discuss any questions you have with your health care provider.  Document Revised: 12/28/2020 Document Reviewed: 12/28/2020  Elsevier Patient Education  2024 [employer].
